# Patient Record
Sex: Female | Born: 1955 | Race: Black or African American | Hispanic: No | Marital: Single | Smoking: Never smoker
Health system: Southern US, Community
[De-identification: ages and names within clinical notes are randomized; demographics above are authoritative.]

## PROBLEM LIST (undated history)

## (undated) DIAGNOSIS — E785 Hyperlipidemia, unspecified: Secondary | ICD-10-CM

## (undated) DIAGNOSIS — E079 Disorder of thyroid, unspecified: Secondary | ICD-10-CM

## (undated) DIAGNOSIS — F329 Major depressive disorder, single episode, unspecified: Secondary | ICD-10-CM

## (undated) DIAGNOSIS — F32A Depression, unspecified: Secondary | ICD-10-CM

## (undated) DIAGNOSIS — I10 Essential (primary) hypertension: Secondary | ICD-10-CM

## (undated) DIAGNOSIS — N289 Disorder of kidney and ureter, unspecified: Secondary | ICD-10-CM

## (undated) HISTORY — PX: LEG SURGERY: SHX1003

## (undated) HISTORY — PX: EYE SURGERY: SHX253

---

## 2009-06-10 ENCOUNTER — Ambulatory Visit: Payer: Self-pay | Admitting: Family Medicine

## 2011-06-15 ENCOUNTER — Emergency Department: Payer: Self-pay | Admitting: Emergency Medicine

## 2016-08-16 ENCOUNTER — Encounter: Payer: Self-pay | Admitting: *Deleted

## 2016-08-16 ENCOUNTER — Ambulatory Visit
Admission: EM | Admit: 2016-08-16 | Discharge: 2016-08-16 | Disposition: A | Discharge status: Home / Self Care | Payer: Worker's Compensation | Attending: Family Medicine | Admitting: Family Medicine

## 2016-08-16 DIAGNOSIS — S0512XA Contusion of eyeball and orbital tissues, left eye, initial encounter: Secondary | ICD-10-CM | POA: Diagnosis not present

## 2016-08-16 DIAGNOSIS — W19XXXA Unspecified fall, initial encounter: Secondary | ICD-10-CM

## 2016-08-16 HISTORY — DX: Depression, unspecified: F32.A

## 2016-08-16 HISTORY — DX: Major depressive disorder, single episode, unspecified: F32.9

## 2016-08-16 HISTORY — DX: Hyperlipidemia, unspecified: E78.5

## 2016-08-16 HISTORY — DX: Essential (primary) hypertension: I10

## 2016-08-16 HISTORY — DX: Disorder of kidney and ureter, unspecified: N28.9

## 2016-08-16 NOTE — ED Triage Notes (Signed)
Patient injured her left eye at work 5 days ago. Patient does have a history of eye injury in her right eye.

## 2016-08-16 NOTE — ED Provider Notes (Addendum)
[facility]    CSN: [id number] Arrival date & time: 08/16/16  1613     History   Chief Complaint Chief Complaint  Patient presents with  . Eye Injury  . Work Related Injury    HPI Veronica Rhodes is a 61 y.o. female.   Patient is a 61 year old black female who received a contusion to the left eye. She states one of the times she was trying to load or unload fell and hit her in the left eye globe of the left eye on the left lower lid lateral aspect. She states it felt funny and felt painful at that time. His got worse according to her since then more irritation and pain she states almost feels that this something still rubbing the eye or irritating the eye. This happened about 6 days ago. He said history surgery on the right eye the left eye is good eye. She injury to that distal area. She does not smoke should history depression hyperlipidemia hypertension and kidney disease. No pertinent family medical history no known drug allergies. Only other surgery was leg surgery and she never smoked. She is allergic to Compazine and sulindac.   The history is provided by the patient.  Eye Injury  This is a new problem. The current episode started more than 2 days ago. The problem has been gradually worsening. Pertinent negatives include no chest pain, no abdominal pain, no headaches and no shortness of breath. Nothing aggravates the symptoms. Nothing relieves the symptoms. The treatment provided no relief.    Past Medical History:  Diagnosis Date  . Depression   . Hyperlipidemia   . Hypertension   . Renal disorder     There are no active problems to display for this patient.   Past Surgical History:  Procedure Laterality Date  . EYE SURGERY    . LEG SURGERY      OB History    No data available       Home Medications    Prior to Admission medications   Medication Sig Start Date End Date Taking? Authorizing Provider  acetaminophen (TYLENOL) 500 MG tablet Take  500 mg by mouth every 6 (six) hours as needed.   Yes [provider]  amitriptyline (ELAVIL) 25 MG tablet Take 25 mg by mouth at bedtime.   Yes [provider]  aspirin EC 81 MG tablet Take 81 mg by mouth daily.   Yes [provider]  enalapril-hydrochlorothiazide (VASERETIC) 10-25 MG tablet Take 1 tablet by mouth daily.   Yes [provider]  levothyroxine (SYNTHROID, LEVOTHROID) 50 MCG tablet Take 50 mcg by mouth daily before breakfast.   Yes [provider]  naproxen (NAPROSYN) 500 MG tablet Take 500 mg by mouth daily as needed.   Yes [provider]  PRAVASTATIN SODIUM PO Take by mouth.   Yes [provider]    Family History History reviewed. No pertinent family history.  Social History Social History  Substance Use Topics  . Smoking status: Never Smoker  . Smokeless tobacco: Never Used  . Alcohol use No     Allergies   Compazine [prochlorperazine] and Sulindac   Review of Systems Review of Systems  Eyes: Positive for pain.  Respiratory: Negative for shortness of breath.   Cardiovascular: Negative for chest pain.  Gastrointestinal: Negative for abdominal pain.  Neurological: Negative for headaches.  All other systems reviewed and are negative.    Physical Exam Triage Vital Signs ED Triage Vitals  Enc  Vitals Group     BP 08/16/16 1706 134/65     Pulse Rate 08/16/16 1706 78     Resp 08/16/16 1706 16     Temp 08/16/16 1706 98.5 F (36.9 C)     Temp Source 08/16/16 1706 Oral     SpO2 08/16/16 1706 99 %     Weight 08/16/16 1706 195 lb (88.5 kg)     Height 08/16/16 1706 5\' 9"  (1.753 m)     Head Circumference --      Peak Flow --      Pain Score 08/16/16 1708 7     Pain Loc --      Pain Edu? --      Excl. in GC? --    No data found.   Updated Vital Signs BP 134/65 (BP Location: Left Arm)   Pulse 78   Temp 98.5 F (36.9 C) (Oral)   Resp 16   Ht 5\' 9"  (1.753 m)   Wt 195 lb (88.5 kg)   SpO2  99%   BMI 28.80 kg/m   Visual Acuity Right Eye Distance: 20/200 Left Eye Distance: 20/30 Bilateral Distance: 20/25  Right Eye Near:   Left Eye Near:    Bilateral Near:     Physical Exam  Constitutional: She is oriented to person, place, and time. She appears well-developed and well-nourished.  HENT:  Head: Normocephalic and atraumatic.  Eyes: Conjunctivae are normal. Pupils are equal, round, and reactive to light.  Neck: Neck supple.  Pulmonary/Chest: Effort normal.  Musculoskeletal: Normal range of motion.  Neurological: She is alert and oriented to person, place, and time.  Skin: Skin is warm.  Vitals reviewed.    UC Treatments / Results  Labs (all labs ordered are listed, but only abnormal results are displayed) Labs Reviewed - No data to display  EKG  EKG Interpretation None       Radiology No results found.  Procedures Procedures (including critical care time)  Medications Ordered in UC Medications - No data to display   Initial Impression / Assessment and Plan / UC Course  I have reviewed the triage vital signs and the nursing notes.  Pertinent labs & imaging results that were available during my care of the patient were reviewed by me and considered in my medical decision making (see chart for details).     With injury occurring about 6 days ago stating that started immediately in the inside information worried about damage to the globe. Withdrawal best urine C ophthalmologist and also will try to get in to see Veronica Rhodes next week as any problems or questions.  Final Clinical Impressions(s) / UC Diagnoses   Final diagnoses:  Contusion of left eye, initial encounter    New Prescriptions New Prescriptions   No medications on file   We will try to get the patient to call her human resource office to get referral for ophthalmologist or optometrist. I've asked our staff to try to make sure that they were notified by the patient since her hands  are tied with at. This point due to the injury to the lobe filled best the next logical step. If further follow-up is needed, recommend patient go some see Veronica Rhodes.  Note: This dictation was prepared with Dragon dictation along with smaller phrase technology. Any transcriptional errors that result from this process are unintentional.   Hassan RowanWade, Luisantonio Adinolfi, MD 08/16/16 1804    Hassan RowanWade, Fidelia Cathers, MD 08/16/16 1806    Hassan RowanWade, Kevin Mario,  MD 08/16/16 1823

## 2016-08-16 NOTE — Discharge Instructions (Signed)
We'll do a best to get patient be seen by an ophthalmologist or optometrist tomorrow for this left eye contusion that occurred 6 days ago.

## 2017-07-27 ENCOUNTER — Emergency Department: Payer: BLUE CROSS/BLUE SHIELD

## 2017-07-27 ENCOUNTER — Emergency Department
Admission: EM | Admit: 2017-07-27 | Discharge: 2017-07-27 | Disposition: A | Discharge status: Home / Self Care | Payer: BLUE CROSS/BLUE SHIELD | Attending: Emergency Medicine | Admitting: Emergency Medicine

## 2017-07-27 ENCOUNTER — Encounter: Payer: Self-pay | Admitting: Emergency Medicine

## 2017-07-27 DIAGNOSIS — Z7982 Long term (current) use of aspirin: Secondary | ICD-10-CM | POA: Diagnosis not present

## 2017-07-27 DIAGNOSIS — Z79899 Other long term (current) drug therapy: Secondary | ICD-10-CM | POA: Diagnosis not present

## 2017-07-27 DIAGNOSIS — R51 Headache: Secondary | ICD-10-CM | POA: Diagnosis not present

## 2017-07-27 DIAGNOSIS — M25561 Pain in right knee: Secondary | ICD-10-CM | POA: Diagnosis present

## 2017-07-27 DIAGNOSIS — I10 Essential (primary) hypertension: Secondary | ICD-10-CM | POA: Diagnosis not present

## 2017-07-27 DIAGNOSIS — M25562 Pain in left knee: Secondary | ICD-10-CM | POA: Diagnosis not present

## 2017-07-27 DIAGNOSIS — W01198A Fall on same level from slipping, tripping and stumbling with subsequent striking against other object, initial encounter: Secondary | ICD-10-CM | POA: Diagnosis not present

## 2017-07-27 DIAGNOSIS — Y990 Civilian activity done for income or pay: Secondary | ICD-10-CM | POA: Insufficient documentation

## 2017-07-27 DIAGNOSIS — F329 Major depressive disorder, single episode, unspecified: Secondary | ICD-10-CM | POA: Insufficient documentation

## 2017-07-27 DIAGNOSIS — W19XXXA Unspecified fall, initial encounter: Secondary | ICD-10-CM

## 2017-07-27 HISTORY — DX: Disorder of thyroid, unspecified: E07.9

## 2017-07-27 MED ORDER — ACETAMINOPHEN 325 MG PO TABS
650.0000 mg | ORAL_TABLET | Freq: Once | ORAL | Status: AC
  Administered 2017-07-27: 650 mg via ORAL
  Filled 2017-07-27: qty 2

## 2017-07-27 MED ORDER — TRAMADOL HCL 50 MG PO TABS: 50.0000 mg | ORAL_TABLET | Freq: Four times a day (QID) | ORAL | 0 refills | Status: AC | PRN

## 2017-07-27 NOTE — ED Notes (Signed)
Patient to waiting room via wheelchair by EMS.  Reports her right leg "locked" up and she fell onto cement floor and struck her head.  Patient reports pain to left sided and back of head, also bilateral knee pain.  EMS VS:  HR - 72; BP 114/82; pulse oxi 100% on room air, cbg 101.

## 2017-07-27 NOTE — ED Triage Notes (Addendum)
Patient states that she was walking and her right leg gave out and caused her to fall. Patient states that her right leg has given out on her in the past. Patient with complaint of bilateral knee pain with abrasions and left side facial pain. Patient denies LOC. Patient states that she takes 81 mg asa daily.

## 2017-07-27 NOTE — ED Provider Notes (Addendum)
[facility] Emergency Department Provider Note  ____________________________________________  Time seen: Approximately 11:15 PM  I have reviewed the triage vital signs and the nursing notes.   HISTORY  Chief Complaint Fall    HPI Veronica Rhodes is a 62 y.o. female presents to the emergency department after patient fell at work.  Patient reports that her right leg "gave out" causing her to fall suddenly.  Patient experienced no loss of consciousness.  Patient is complaining of bilateral knee pain and left-sided facial pain.    She reports that she takes aspirin daily.  She denies new onset blurry vision, chest pain, chest tightness, shortness of breath, nausea, vomiting abdominal pain.  No radiculopathy of the upper or lower extremities.  Patient has been able to ambulate without difficulty.   Past Medical History:  Diagnosis Date  . Depression   . Hyperlipidemia   . Hypertension   . Renal disorder   . Thyroid disease     There are no active problems to display for this patient.   Past Surgical History:  Procedure Laterality Date  . EYE SURGERY    . LEG SURGERY      Prior to Admission medications   Medication Sig Start Date End Date Taking? Authorizing Provider  acetaminophen (TYLENOL) 500 MG tablet Take 500 mg by mouth every 6 (six) hours as needed.    [provider]  amitriptyline (ELAVIL) 25 MG tablet Take 25 mg by mouth at bedtime.    [provider]  aspirin EC 81 MG tablet Take 81 mg by mouth daily.    [provider]  enalapril-hydrochlorothiazide (VASERETIC) 10-25 MG tablet Take 1 tablet by mouth daily.    [provider]  levothyroxine (SYNTHROID, LEVOTHROID) 50 MCG tablet Take 50 mcg by mouth daily before breakfast.    [provider]  naproxen (NAPROSYN) 500 MG tablet Take 500 mg by mouth daily as needed.    [provider]  PRAVASTATIN SODIUM PO Take by mouth.    [provider]  traMADol (ULTRAM) 50 MG tablet Take 1 tablet (50 mg total) by mouth every 6 (six) hours as needed for up to 3 days. 07/27/17 07/30/17  Orvil Feil, PA-C    Allergies Compazine [prochlorperazine] and Sulindac  No family history on file.  Social History Social History   Tobacco Use  . Smoking status: Never Smoker  . Smokeless tobacco: Never Used  Substance Use Topics  . Alcohol use: No  . Drug use: No     Review of Systems  Constitutional: Patient has left-sided facial pain. Eyes: No visual changes. No discharge ENT: No upper respiratory complaints. Cardiovascular: no chest pain. Respiratory: no cough. No SOB. Gastrointestinal: No abdominal pain.  No nausea, no vomiting.  No diarrhea.  No constipation. Musculoskeletal: Patient has neck pain and bilateral knee pain. Skin: Negative for rash, abrasions, lacerations, ecchymosis. Neurological: Negative for headaches, focal weakness or numbness.  ____________________________________________   PHYSICAL EXAM:  VITAL SIGNS: ED Triage Vitals [07/27/17 1939]  Enc Vitals Group     BP (!) 125/50     Pulse Rate 70     Resp 18     Temp 98.1 F (36.7 C)     Temp Source Oral     SpO2 100 %     Weight 195 lb (88.5 kg)     Height 5\' 9"  (1.753 m)     Head Circumference      Peak Flow  Pain Score 8     Pain Loc      Pain Edu?      Excl. in GC?      Constitutional: Alert and oriented. Well appearing and in no acute distress.  Patient easily ambulated from wheelchair to bed. Eyes: Conjunctivae are normal. PERRL. EOMI. Head: Atraumatic.  Patient had mild tenderness to palpation over the left side of forehead.  No tenderness was elicited with palpation over the bilateral inferior orbits. ENT:      Ears: TMs are pearly.  No discharge from the ears and no ecchymosis behind the pinna bilaterally.      Nose: No congestion/rhinnorhea.      Mouth/Throat: Mucous membranes are moist.  Neck: No stridor.  No cervical spine  tenderness to palpation. Cardiovascular: Normal rate, regular rhythm. Normal S1 and S2.  Good peripheral circulation. Respiratory: Normal respiratory effort without tachypnea or retractions. Lungs CTAB. Good air entry to the bases with no decreased or absent breath sounds. Gastrointestinal: Bowel sounds 4 quadrants. Soft and nontender to palpation. No guarding or rigidity. No palpable masses. No distention. No CVA tenderness. Musculoskeletal: Full range of motion to all extremities. No gross deformities appreciated. Neurologic:  Normal speech and language. No gross focal neurologic deficits are appreciated.  Skin:  Skin is warm, dry and intact. No rash noted. Psychiatric: Mood and affect are normal. Speech and behavior are normal. Patient exhibits appropriate insight and judgement.   ____________________________________________   LABS (all labs ordered are listed, but only abnormal results are displayed)  Labs Reviewed - No data to display ____________________________________________  EKG   ____________________________________________  RADIOLOGY Geraldo Pitter, personally viewed and evaluated these images (plain radiographs) as part of my medical decision making, as well as reviewing the written report by the radiologist.  Ct Head Wo Contrast  Result Date: 07/27/2017 CLINICAL DATA:  Fall with head injury.  Posterior head pain. EXAM: CT HEAD WITHOUT CONTRAST CT MAXILLOFACIAL WITHOUT CONTRAST CT CERVICAL SPINE WITHOUT CONTRAST TECHNIQUE: Multidetector CT imaging of the head, cervical spine, and maxillofacial structures were performed using the standard protocol without intravenous contrast. Multiplanar CT image reconstructions of the cervical spine and maxillofacial structures were also generated. COMPARISON:  CT head and cervical spine 06/15/2011. FINDINGS: CT HEAD FINDINGS Brain: There is no evidence of acute intracranial hemorrhage, mass lesion, brain edema or extra-axial fluid  collection. The ventricles and subarachnoid spaces are appropriately sized for age. Incidental empty sella turcica. No evidence of acute stroke. Vascular: No hyperdense vessel or unexpected calcification. Skull: Mild calvarial hyperostosis. No evidence of acute fracture or focally suspicious lesion. Other: None. CT MAXILLOFACIAL FINDINGS Osseous: No evidence of acute facial fracture. The mandible and temporomandibular joints are intact. Several teeth are missing. Orbits: Status post scleral banding and probable lens surgery on the right. The globes are intact. No evidence of orbital hematoma. The optic nerves and extraocular muscles appear normal. Sinuses: Probable small mucous retention cysts in the left frontal and maxillary sinuses. Minimal mucosal thickening in the ethmoid sinuses. No air-fluid levels. The mastoid air cells and middle ears are clear. Soft tissues: Possible mild soft tissue swelling over the chin. No focal hematoma. CT CERVICAL SPINE FINDINGS Alignment: Mild straightening without focal angulation or listhesis. Skull base and vertebrae: No evidence of acute cervical spine fracture or traumatic subluxation. Soft tissues and spinal canal: No prevertebral fluid or swelling. No visible canal hematoma. Disc levels: Minimal intervertebral spurring. No evidence of large disc herniation or significant spinal stenosis. Upper chest:  Unremarkable. Other: None. IMPRESSION: 1. No acute intracranial or calvarial findings. 2. No evidence of maxillofacial fracture or orbital hematoma. 3. No evidence of acute cervical spine fracture, traumatic subluxation or static signs of instability. Minimal spondylosis. Electronically Signed   By: Carey Bullocks M.D.   On: 07/27/2017 20:31   Ct Cervical Spine Wo Contrast  Result Date: 07/27/2017 CLINICAL DATA:  Fall with head injury.  Posterior head pain. EXAM: CT HEAD WITHOUT CONTRAST CT MAXILLOFACIAL WITHOUT CONTRAST CT CERVICAL SPINE WITHOUT CONTRAST TECHNIQUE:  Multidetector CT imaging of the head, cervical spine, and maxillofacial structures were performed using the standard protocol without intravenous contrast. Multiplanar CT image reconstructions of the cervical spine and maxillofacial structures were also generated. COMPARISON:  CT head and cervical spine 06/15/2011. FINDINGS: CT HEAD FINDINGS Brain: There is no evidence of acute intracranial hemorrhage, mass lesion, brain edema or extra-axial fluid collection. The ventricles and subarachnoid spaces are appropriately sized for age. Incidental empty sella turcica. No evidence of acute stroke. Vascular: No hyperdense vessel or unexpected calcification. Skull: Mild calvarial hyperostosis. No evidence of acute fracture or focally suspicious lesion. Other: None. CT MAXILLOFACIAL FINDINGS Osseous: No evidence of acute facial fracture. The mandible and temporomandibular joints are intact. Several teeth are missing. Orbits: Status post scleral banding and probable lens surgery on the right. The globes are intact. No evidence of orbital hematoma. The optic nerves and extraocular muscles appear normal. Sinuses: Probable small mucous retention cysts in the left frontal and maxillary sinuses. Minimal mucosal thickening in the ethmoid sinuses. No air-fluid levels. The mastoid air cells and middle ears are clear. Soft tissues: Possible mild soft tissue swelling over the chin. No focal hematoma. CT CERVICAL SPINE FINDINGS Alignment: Mild straightening without focal angulation or listhesis. Skull base and vertebrae: No evidence of acute cervical spine fracture or traumatic subluxation. Soft tissues and spinal canal: No prevertebral fluid or swelling. No visible canal hematoma. Disc levels: Minimal intervertebral spurring. No evidence of large disc herniation or significant spinal stenosis. Upper chest: Unremarkable. Other: None. IMPRESSION: 1. No acute intracranial or calvarial findings. 2. No evidence of maxillofacial fracture or  orbital hematoma. 3. No evidence of acute cervical spine fracture, traumatic subluxation or static signs of instability. Minimal spondylosis. Electronically Signed   By: Carey Bullocks M.D.   On: 07/27/2017 20:31   Dg Knee Complete 4 Views Left  Result Date: 07/27/2017 CLINICAL DATA:  Status post fall with left knee pain. EXAM: LEFT KNEE - COMPLETE 4+ VIEW COMPARISON:  None. FINDINGS: No evidence of fracture, dislocation, or joint effusion. Mild tibial spine osteophytosis is noted. Soft tissues are unremarkable. IMPRESSION: No acute fracture or dislocation. Electronically Signed   By: Sherian Rein M.D.   On: 07/27/2017 20:29   Dg Knee Complete 4 Views Right  Result Date: 07/27/2017 CLINICAL DATA:  Status post fall with right knee pain. EXAM: RIGHT KNEE - COMPLETE 4+ VIEW COMPARISON:  None. FINDINGS: No evidence of fracture, dislocation, or joint effusion. Minimal tibial spine osteophytosis is noted. Soft tissues are unremarkable. IMPRESSION: No acute fracture or dislocation. Electronically Signed   By: Sherian Rein M.D.   On: 07/27/2017 20:28   Ct Maxillofacial Wo Contrast  Result Date: 07/27/2017 CLINICAL DATA:  Fall with head injury.  Posterior head pain. EXAM: CT HEAD WITHOUT CONTRAST CT MAXILLOFACIAL WITHOUT CONTRAST CT CERVICAL SPINE WITHOUT CONTRAST TECHNIQUE: Multidetector CT imaging of the head, cervical spine, and maxillofacial structures were performed using the standard protocol without intravenous contrast. Multiplanar CT image reconstructions of  the cervical spine and maxillofacial structures were also generated. COMPARISON:  CT head and cervical spine 06/15/2011. FINDINGS: CT HEAD FINDINGS Brain: There is no evidence of acute intracranial hemorrhage, mass lesion, brain edema or extra-axial fluid collection. The ventricles and subarachnoid spaces are appropriately sized for age. Incidental empty sella turcica. No evidence of acute stroke. Vascular: No hyperdense vessel or unexpected  calcification. Skull: Mild calvarial hyperostosis. No evidence of acute fracture or focally suspicious lesion. Other: None. CT MAXILLOFACIAL FINDINGS Osseous: No evidence of acute facial fracture. The mandible and temporomandibular joints are intact. Several teeth are missing. Orbits: Status post scleral banding and probable lens surgery on the right. The globes are intact. No evidence of orbital hematoma. The optic nerves and extraocular muscles appear normal. Sinuses: Probable small mucous retention cysts in the left frontal and maxillary sinuses. Minimal mucosal thickening in the ethmoid sinuses. No air-fluid levels. The mastoid air cells and middle ears are clear. Soft tissues: Possible mild soft tissue swelling over the chin. No focal hematoma. CT CERVICAL SPINE FINDINGS Alignment: Mild straightening without focal angulation or listhesis. Skull base and vertebrae: No evidence of acute cervical spine fracture or traumatic subluxation. Soft tissues and spinal canal: No prevertebral fluid or swelling. No visible canal hematoma. Disc levels: Minimal intervertebral spurring. No evidence of large disc herniation or significant spinal stenosis. Upper chest: Unremarkable. Other: None. IMPRESSION: 1. No acute intracranial or calvarial findings. 2. No evidence of maxillofacial fracture or orbital hematoma. 3. No evidence of acute cervical spine fracture, traumatic subluxation or static signs of instability. Minimal spondylosis. Electronically Signed   By: Carey BullocksWilliam  Veazey M.D.   On: 07/27/2017 20:31    ____________________________________________    PROCEDURES  Procedure(s) performed:    Procedures    Medications  acetaminophen (TYLENOL) tablet 650 mg (has no administration in time range)     ____________________________________________   INITIAL IMPRESSION / ASSESSMENT AND PLAN / ED COURSE  Pertinent labs & imaging results that were available during my care of the patient were reviewed by me and  considered in my medical decision making (see chart for details).  Review of the [place] CSRS was performed in accordance of the [facility] prior to dispensing any controlled drugs.     Assessment and plan Fall differential diagnosis included subdural hematoma, skull fracture, Knee fracture and contusion Patient presents to the emergency department after a fall that occurred earlier today.  Patient reported headache, left-sided facial tenderness and bilateral knee pain.  Work-up conducted in the emergency department was reassuring.  Overall physical exam was also reassuring.  Patient was given Tylenol in the emergency department and discharged with a short course of tramadol.  She was advised to follow-up with primary care as needed.  All patient questions were answered.   ____________________________________________  FINAL CLINICAL IMPRESSION(S) / ED DIAGNOSES  Final diagnoses:  Fall, initial encounter      NEW MEDICATIONS STARTED DURING THIS VISIT:  ED Discharge Orders        Ordered    traMADol (ULTRAM) 50 MG tablet  Every 6 hours PRN     07/27/17 2302          This chart was dictated using voice recognition software/Dragon. Despite best efforts to proofread, errors can occur which can change the meaning. Any change was purely unintentional.    Orvil FeilWoods, Estelle Greenleaf M, PA-C 07/27/17 2320    Pia MauWoods, Oceane Fosse CaguasM, PA-C 07/27/17 2321    Phineas SemenGoodman, Gray[place], MD 07/29/17 [phone]

## 2019-08-20 IMAGING — CT CT CERVICAL SPINE W/O CM
5 of 12 series · 9 of 33 positions shown, 10 images · non-contrast
Comparison: CT head and cervical spine 06/15/2011.

CLINICAL DATA: Fall with head injury.  Posterior head pain.

EXAM:
CT HEAD WITHOUT CONTRAST
CT MAXILLOFACIAL WITHOUT CONTRAST
CT CERVICAL SPINE WITHOUT CONTRAST
TECHNIQUE: Multidetector CT imaging of the head, cervical spine, and
maxillofacial structures were performed using the standard protocol
without intravenous contrast. Multiplanar CT image reconstructions
of the cervical spine and maxillofacial structures were also
generated.

[Series 6: max soft · axial · 0.33mm/px · z∈[+344,+394]mm · 2 of 75 slices shown]
[im 25/75  soft-tissue]
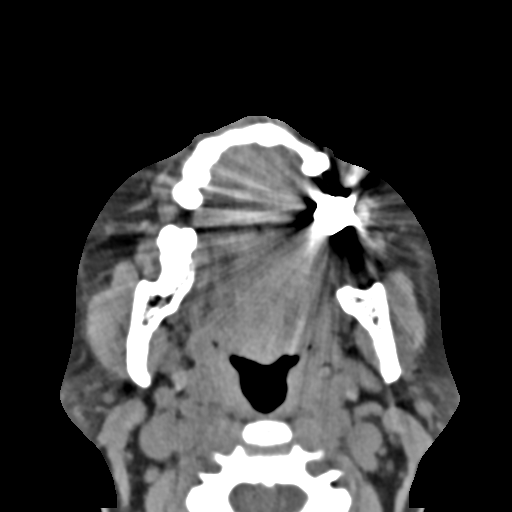
[im 50/75  soft-tissue]
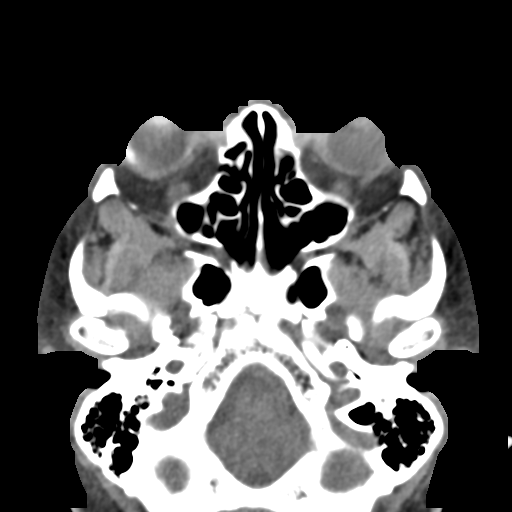

[Series 12: coronal bone · coronal · 0.34mm/px · 1 of 84 slices shown]
[im 9/84  bone]
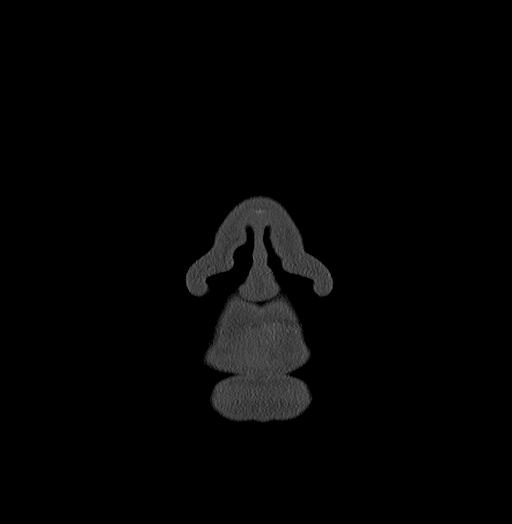

[Series 13: sagittal bone · sagittal · 0.36mm/px · 2 of 76 slices shown]
[im 26/76  bone]
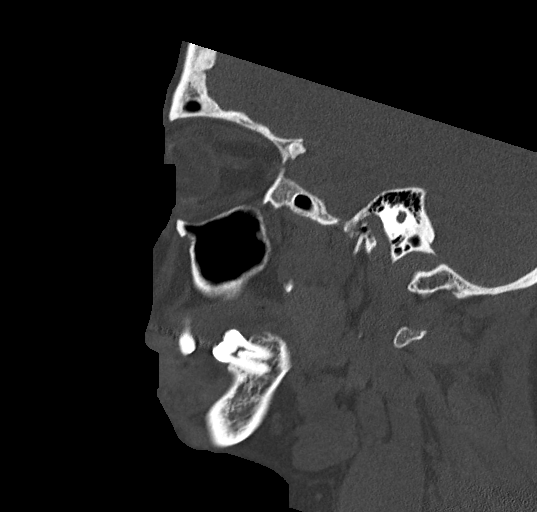
[im 51/76  bone]
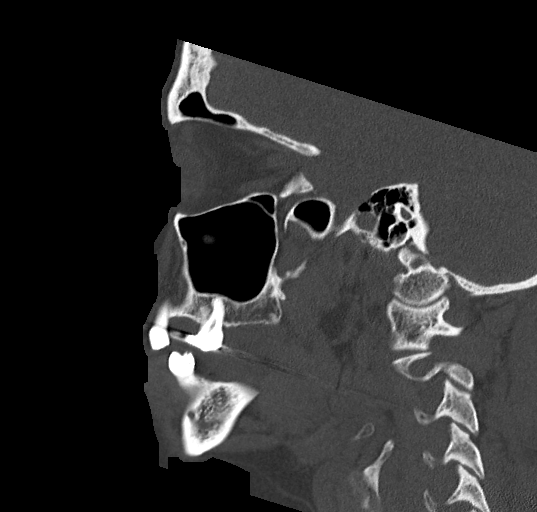

[Series 15: c spine soft · axial · 0.30mm/px · z∈[+311,+369]mm · 2 of 87 slices shown]
[im 29/87  soft-tissue]
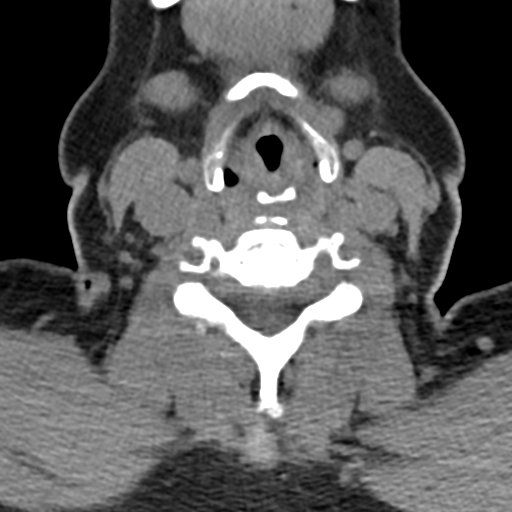
[im 58/87  soft-tissue]
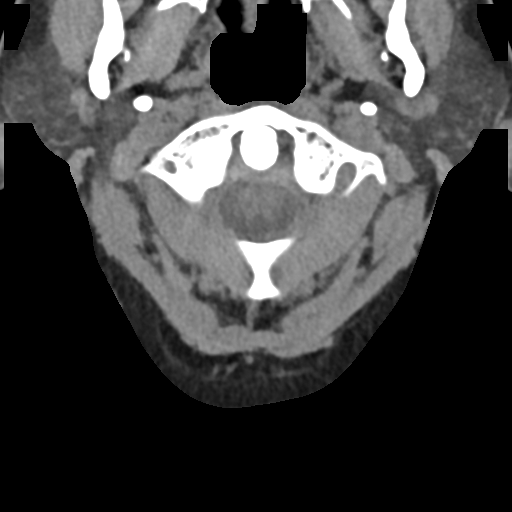

[Series 18: orthogonal bone · axial · 0.24mm/px · z∈[+292,+345]mm · 2 of 90 slices shown, 3 images]
[im 30/90  soft-tissue]
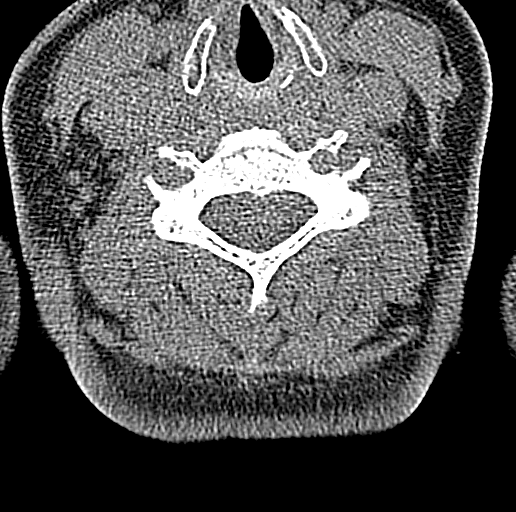
[im 30/90  bone]
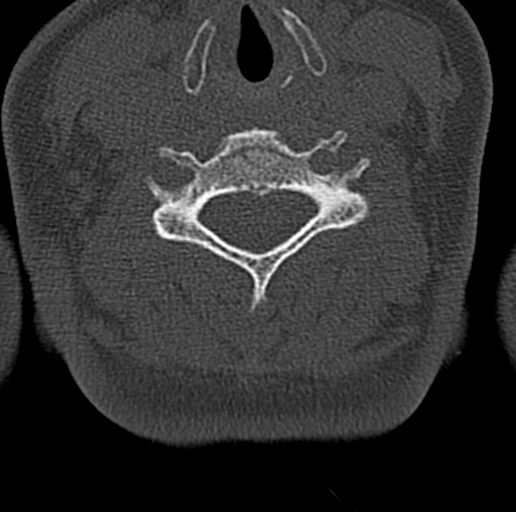
[im 60/90  bone]
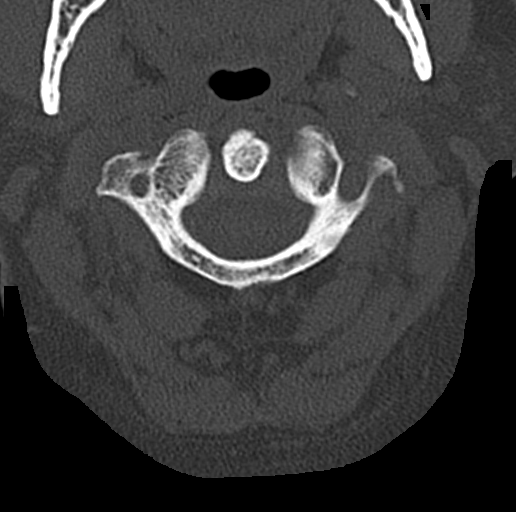

[9 of 33 positions shown; findings below may reference images not displayed]

FINDINGS: CT HEAD FINDINGS

Brain: There is no evidence of acute intracranial hemorrhage, mass
lesion, brain edema or extra-axial fluid collection. The ventricles
and subarachnoid spaces are appropriately sized for age. Incidental
empty sella turcica. No evidence of acute stroke.

Vascular: No hyperdense vessel or unexpected calcification.

Skull: Mild calvarial hyperostosis. No evidence of acute fracture or
focally suspicious lesion.

Other: None.

CT MAXILLOFACIAL FINDINGS

Osseous: No evidence of acute facial fracture. The mandible and
temporomandibular joints are intact. Several teeth are missing.

Orbits: Status post scleral banding and probable lens surgery on the
right. The globes are intact. No evidence of orbital hematoma. The
optic nerves and extraocular muscles appear normal.

Sinuses: Probable small mucous retention cysts in the left frontal
and maxillary sinuses. Minimal mucosal thickening in the ethmoid
sinuses. No air-fluid levels. The mastoid air cells and middle ears
are clear.

Soft tissues: Possible mild soft tissue swelling over the chin. No
focal hematoma.

CT CERVICAL SPINE FINDINGS

Alignment: Mild straightening without focal angulation or listhesis.

Skull base and vertebrae: No evidence of acute cervical spine
fracture or traumatic subluxation.

Soft tissues and spinal canal: No prevertebral fluid or swelling. No
visible canal hematoma.

Disc levels: Minimal intervertebral spurring. No evidence of large
disc herniation or significant spinal stenosis.

Upper chest: Unremarkable.

Other: None.
IMPRESSION: 1. No acute intracranial or calvarial findings.
2. No evidence of maxillofacial fracture or orbital hematoma.
3. No evidence of acute cervical spine fracture, traumatic
subluxation or static signs of instability. Minimal spondylosis.

## 2019-08-20 IMAGING — CR DG KNEE COMPLETE 4+V*R*
1 series · 4 of 4 positions shown · non-contrast
Comparison: None.

CLINICAL DATA: Status post fall with right knee pain.

EXAM:
RIGHT KNEE - COMPLETE 4+ VIEW

[Series 1: dg knee complete 4 views right · 0.14mm/px · 4 of 4 slices shown]
[im 1/4]
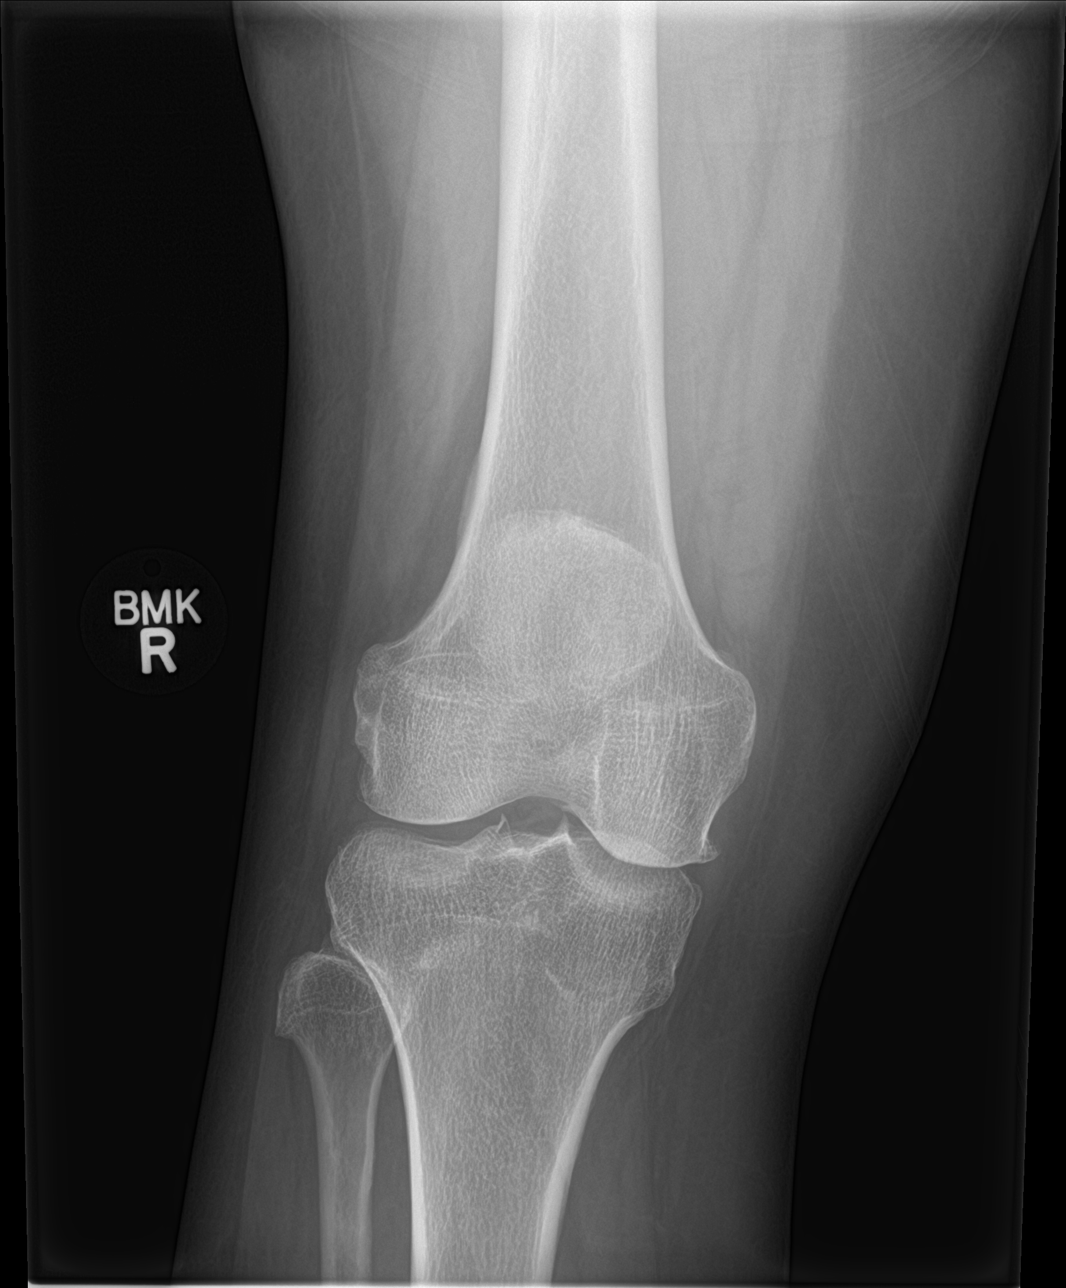
[im 2/4]
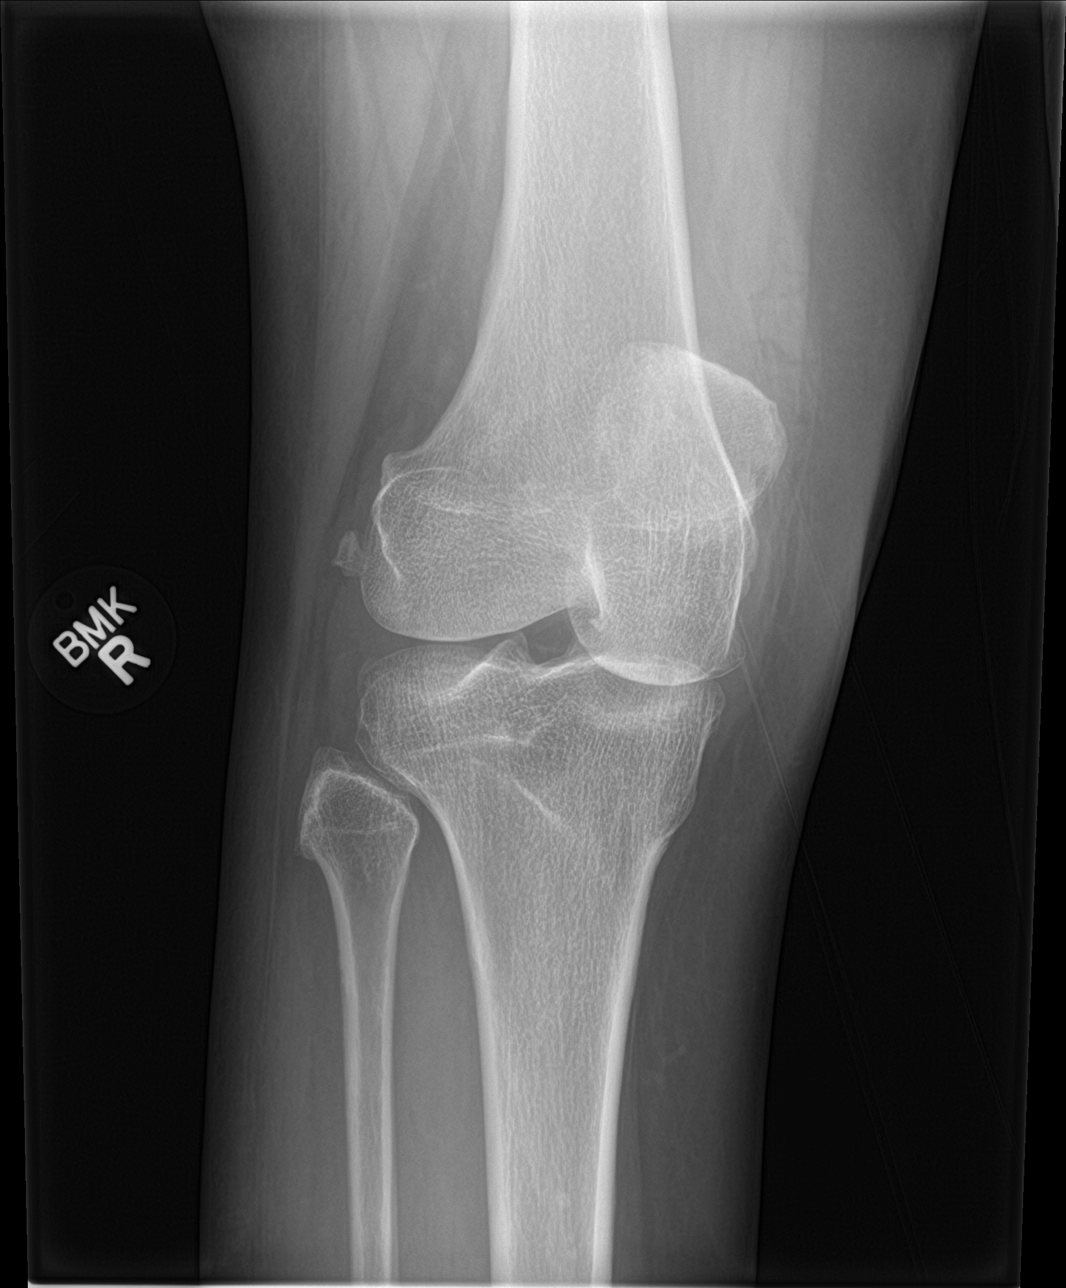
[im 3/4]
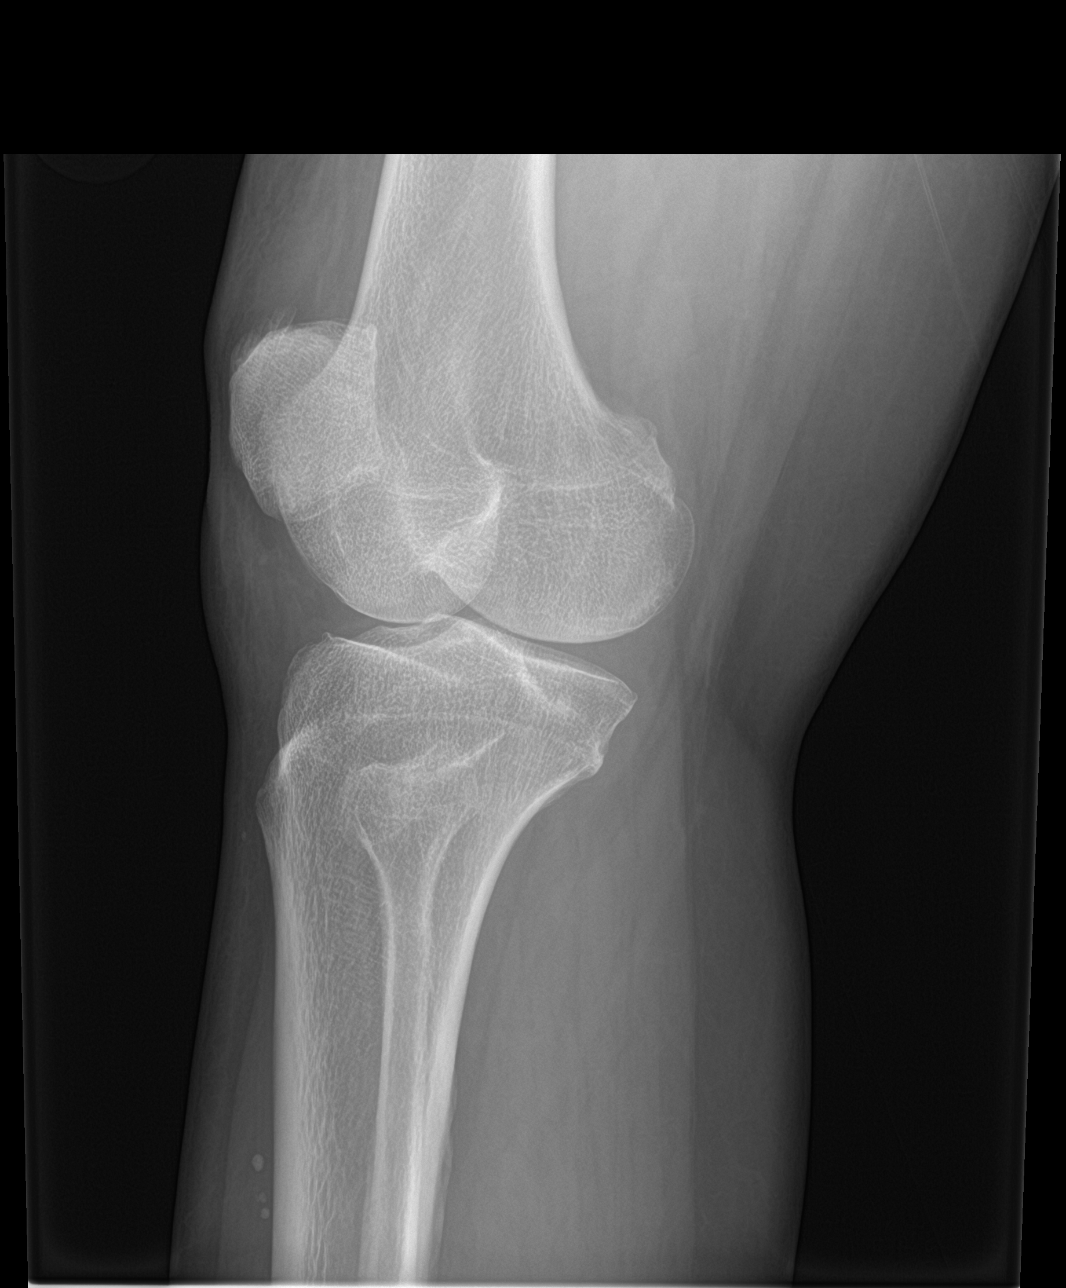
[im 4/4]
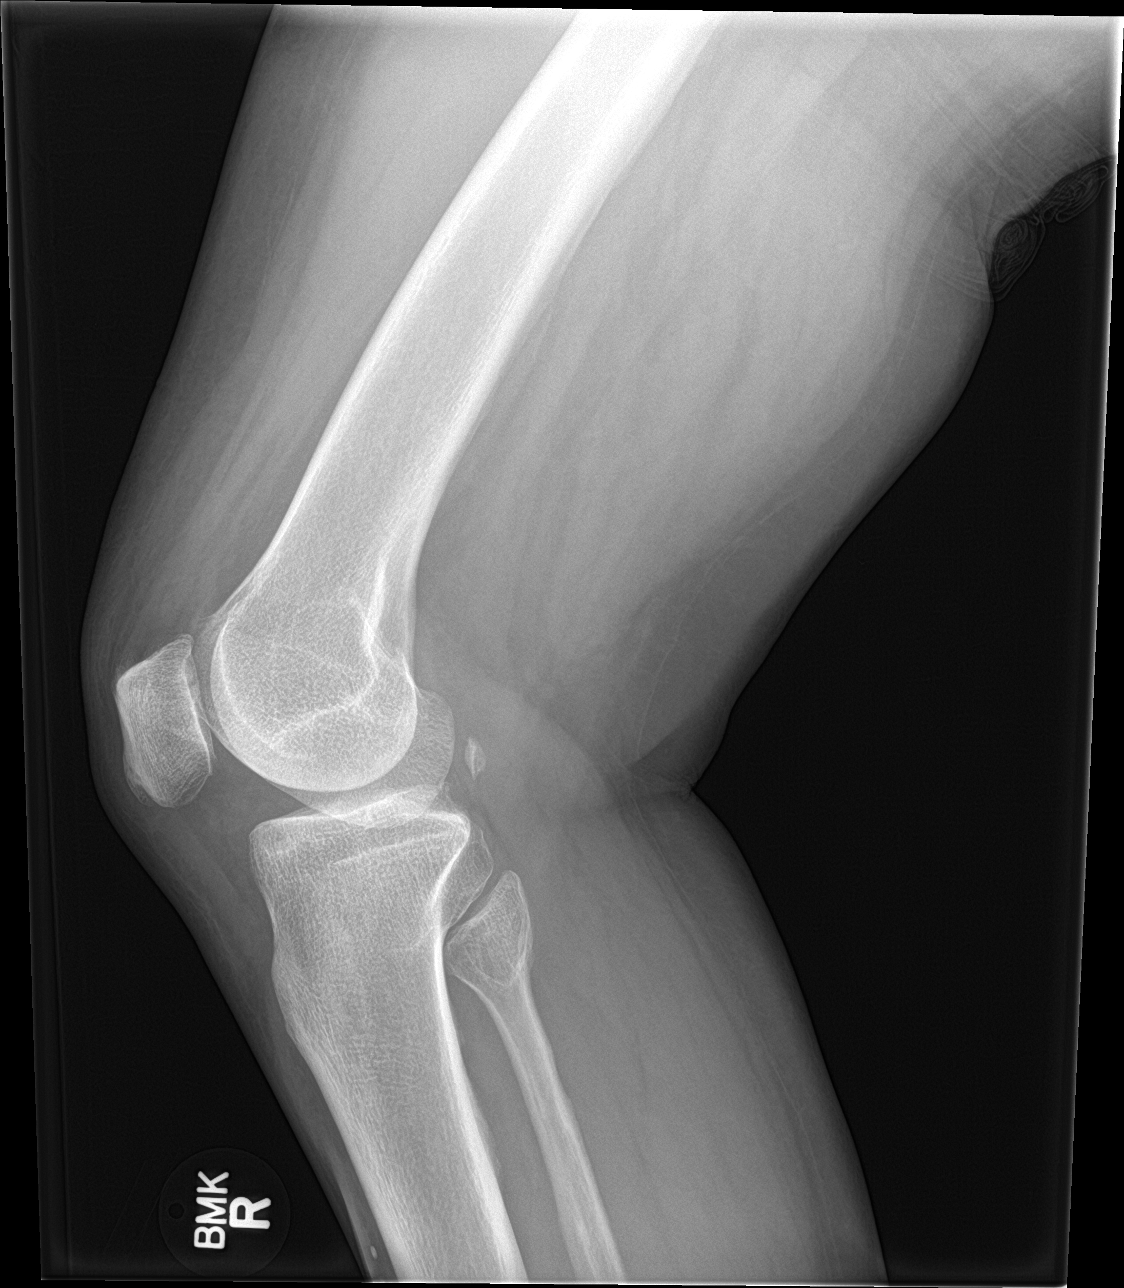

[4 of 4 positions shown; findings below may reference images not displayed]

FINDINGS: No evidence of fracture, dislocation, or joint effusion. Minimal
tibial spine osteophytosis is noted. Soft tissues are unremarkable.
IMPRESSION: No acute fracture or dislocation.

## 2019-08-20 IMAGING — CR DG KNEE COMPLETE 4+V*L*
1 series · 6 of 6 positions shown · non-contrast
Comparison: None.

CLINICAL DATA: Status post fall with left knee pain.

EXAM:
LEFT KNEE - COMPLETE 4+ VIEW

[Series 1: dg knee complete 4 views left · 0.14mm/px · 6 of 6 slices shown]
[im 1/6]
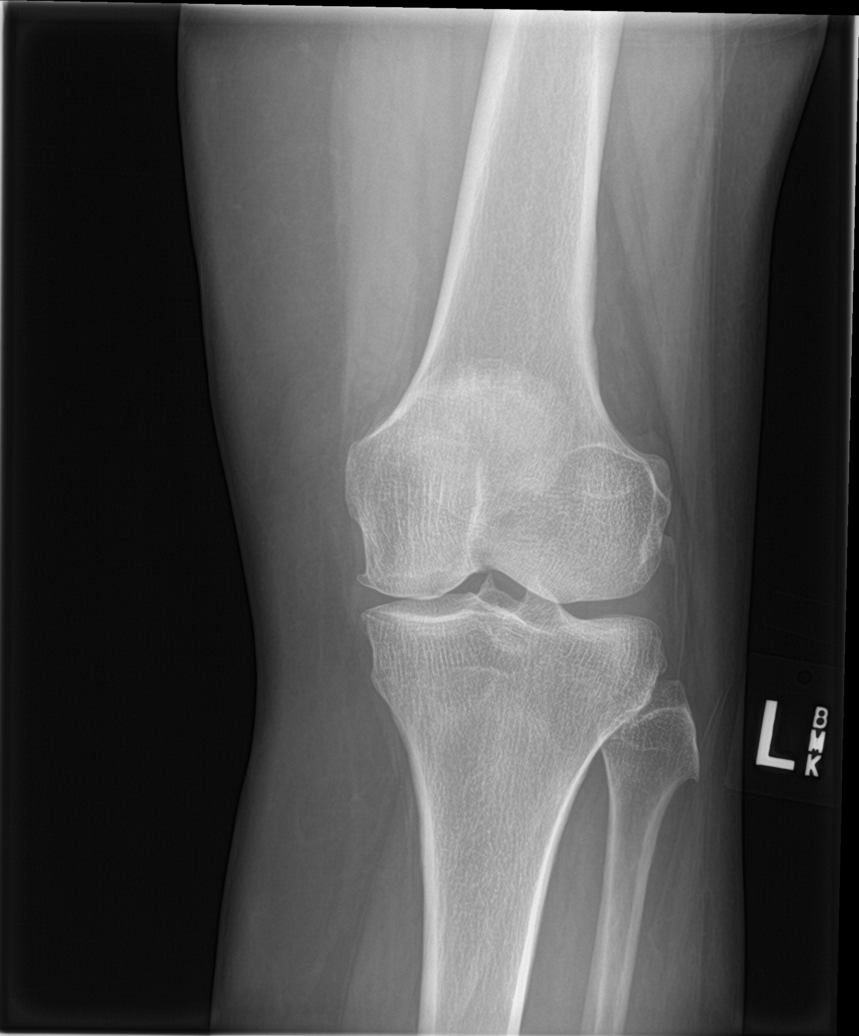
[im 2/6]
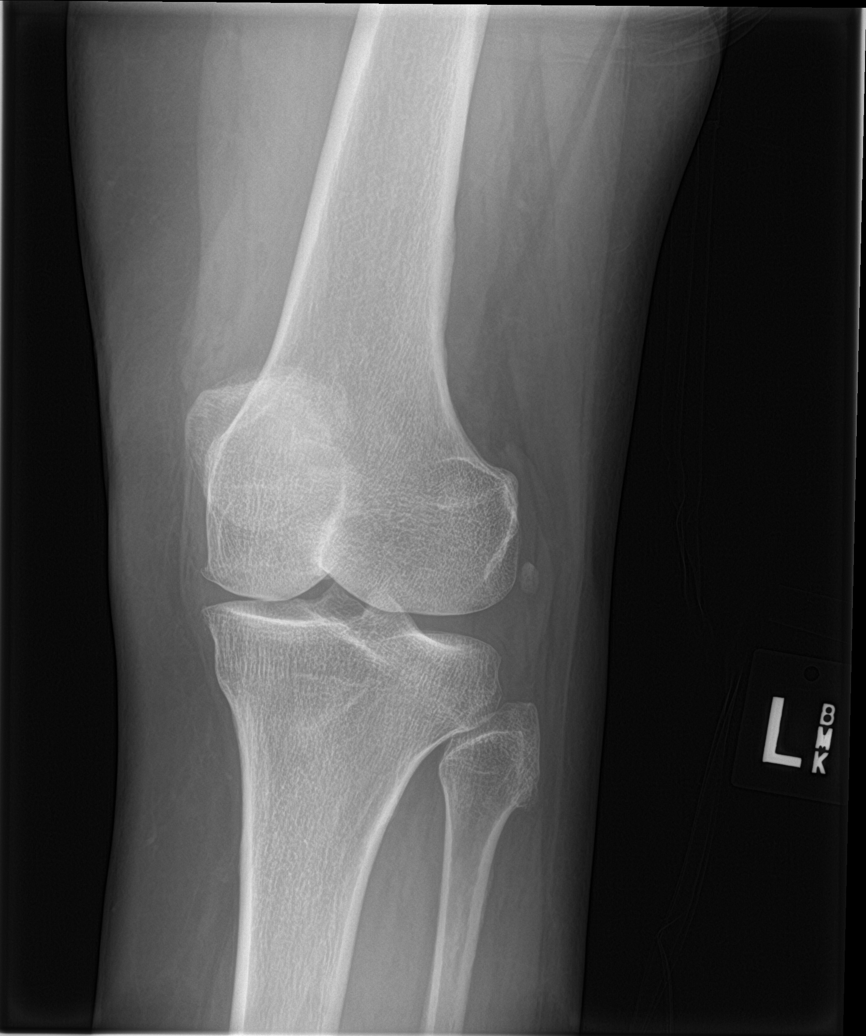
[im 3/6]
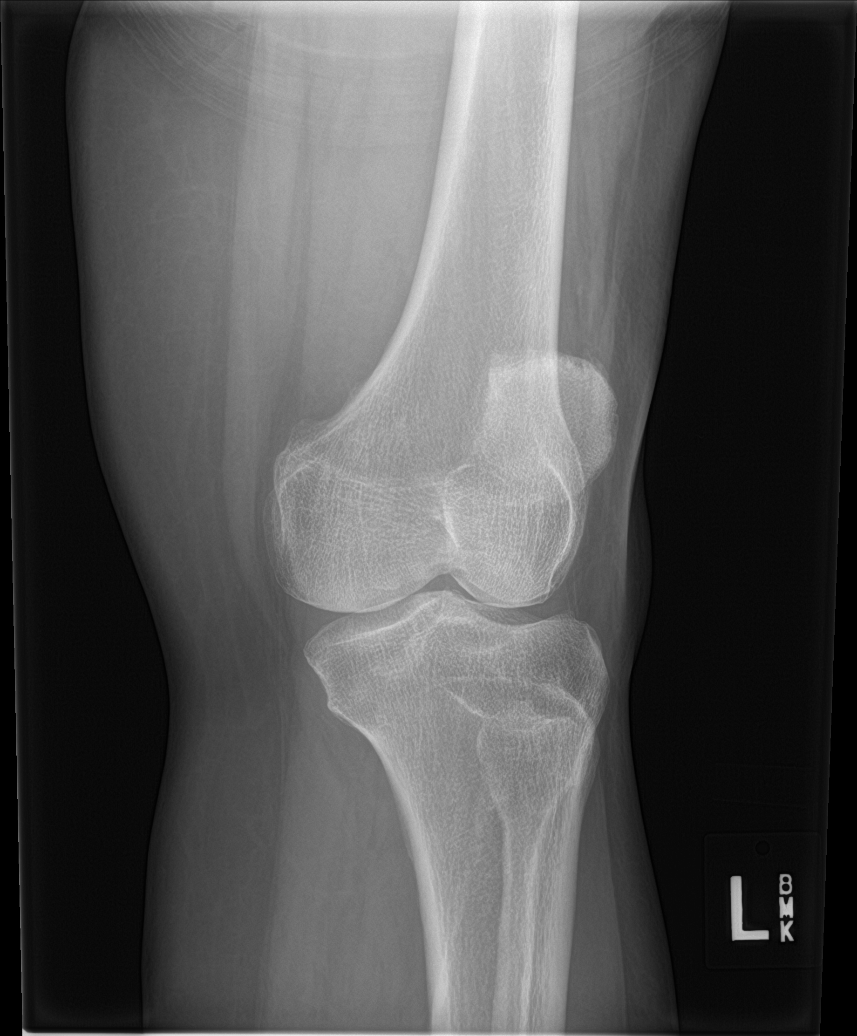
[im 4/6]
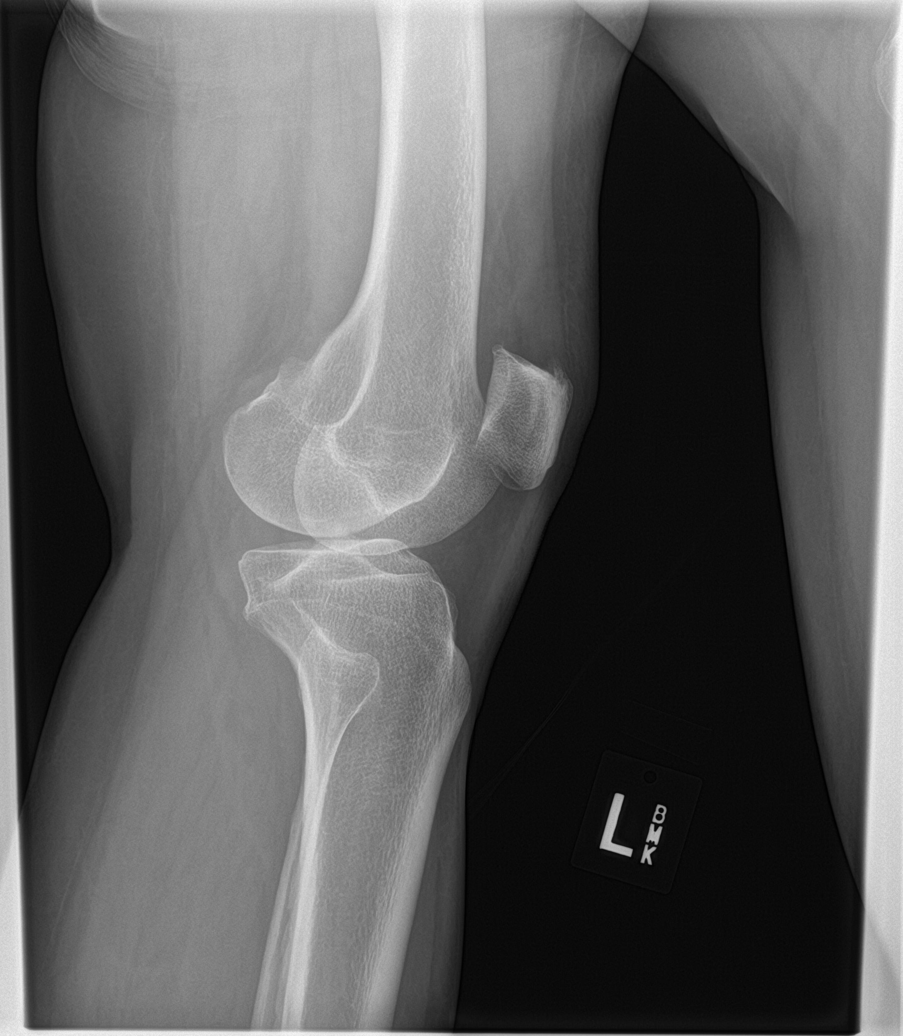
[im 5/6]
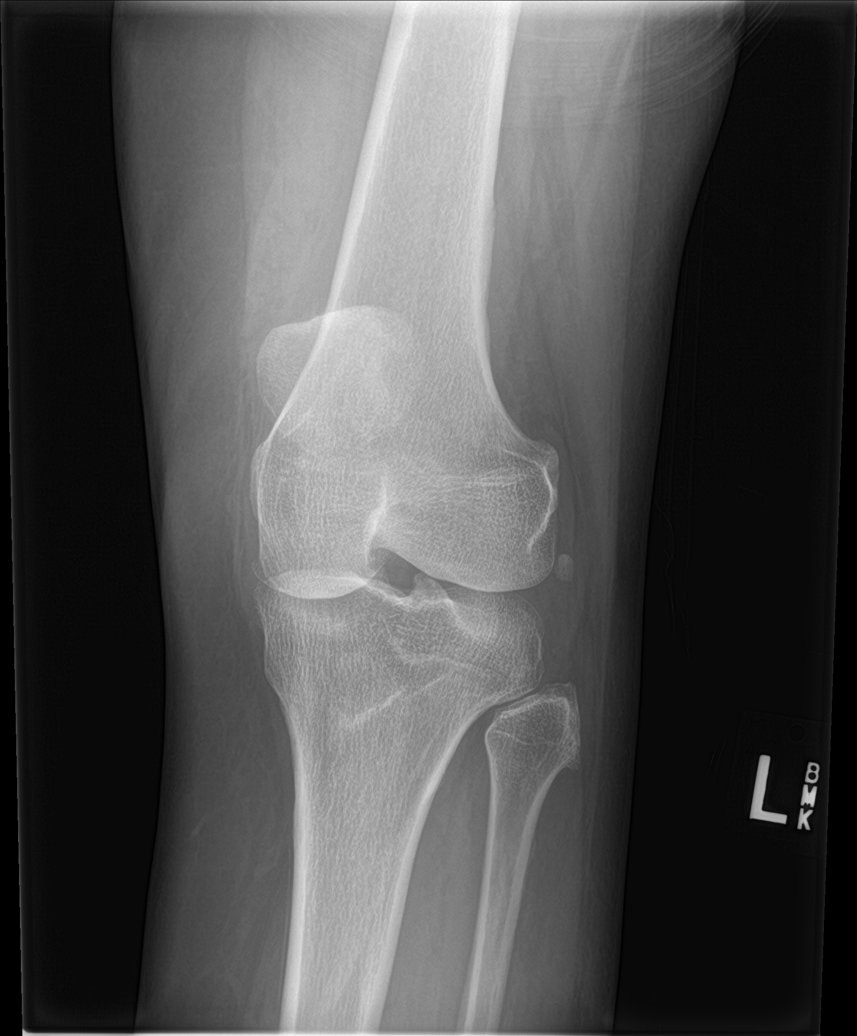
[im 6/6]
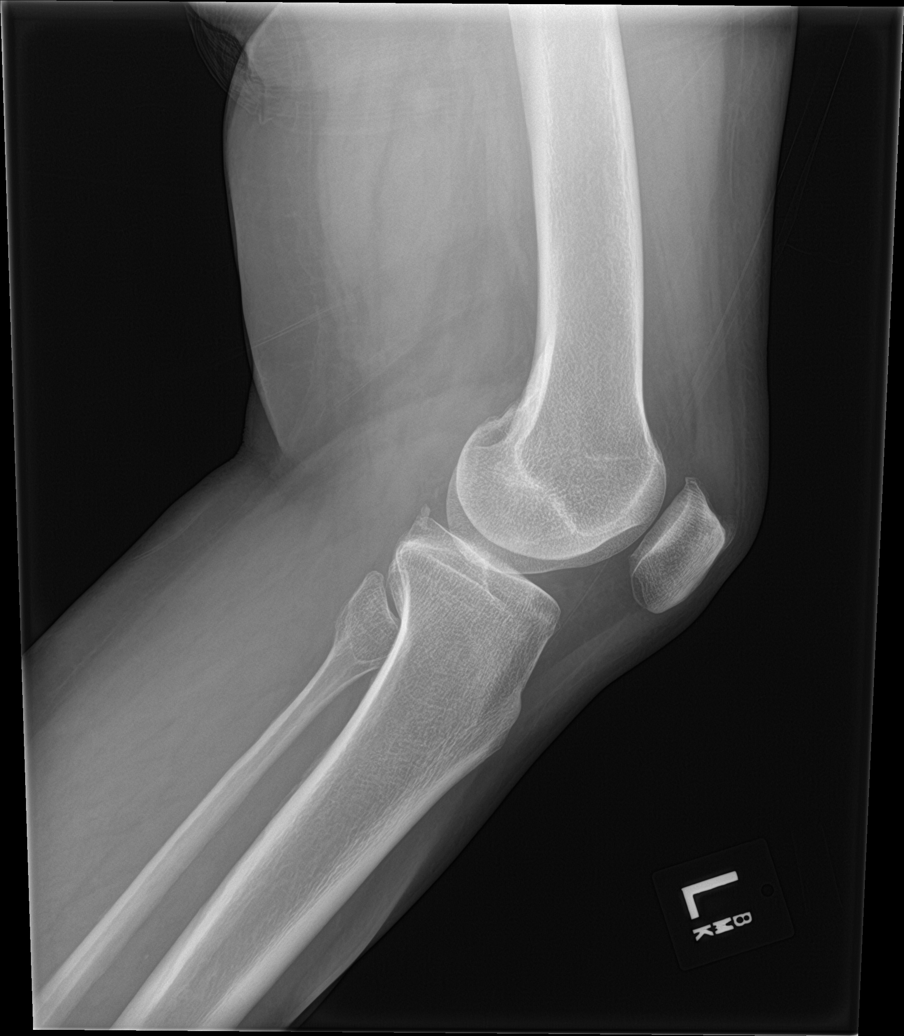

[6 of 6 positions shown; findings below may reference images not displayed]

FINDINGS: No evidence of fracture, dislocation, or joint effusion. Mild tibial
spine osteophytosis is noted. Soft tissues are unremarkable.
IMPRESSION: No acute fracture or dislocation.
